# Patient Record
Sex: Female | Born: 1965 | Race: Black or African American | Hispanic: No | Marital: Single | Smoking: Never smoker
Health system: Southern US, Community
[De-identification: ages and names within clinical notes are randomized; demographics above are authoritative.]

## PROBLEM LIST (undated history)

## (undated) DIAGNOSIS — F32A Depression, unspecified: Secondary | ICD-10-CM

## (undated) DIAGNOSIS — J45909 Unspecified asthma, uncomplicated: Secondary | ICD-10-CM

## (undated) DIAGNOSIS — F419 Anxiety disorder, unspecified: Secondary | ICD-10-CM

## (undated) DIAGNOSIS — Z8042 Family history of malignant neoplasm of prostate: Secondary | ICD-10-CM

## (undated) DIAGNOSIS — Z800 Family history of malignant neoplasm of digestive organs: Secondary | ICD-10-CM

## (undated) DIAGNOSIS — Z803 Family history of malignant neoplasm of breast: Secondary | ICD-10-CM

## (undated) DIAGNOSIS — F329 Major depressive disorder, single episode, unspecified: Secondary | ICD-10-CM

## (undated) DIAGNOSIS — C801 Malignant (primary) neoplasm, unspecified: Secondary | ICD-10-CM

## (undated) DIAGNOSIS — I10 Essential (primary) hypertension: Secondary | ICD-10-CM

## (undated) HISTORY — PX: TONSILLECTOMY: SUR1361

## (undated) HISTORY — PX: TUBAL LIGATION: SHX77

## (undated) HISTORY — DX: Unspecified asthma, uncomplicated: J45.909

## (undated) HISTORY — DX: Family history of malignant neoplasm of prostate: Z80.42

## (undated) HISTORY — DX: Family history of malignant neoplasm of breast: Z80.3

## (undated) HISTORY — DX: Malignant (primary) neoplasm, unspecified: C80.1

## (undated) HISTORY — PX: COLONOSCOPY: SHX174

## (undated) HISTORY — DX: Family history of malignant neoplasm of digestive organs: Z80.0

---

## 2016-07-13 ENCOUNTER — Other Ambulatory Visit: Payer: Self-pay | Admitting: Gynecology

## 2016-07-13 ENCOUNTER — Other Ambulatory Visit: Payer: Self-pay | Admitting: Family Medicine

## 2016-07-13 DIAGNOSIS — R921 Mammographic calcification found on diagnostic imaging of breast: Secondary | ICD-10-CM

## 2016-07-20 ENCOUNTER — Ambulatory Visit
Admission: RE | Admit: 2016-07-20 | Discharge: 2016-07-20 | Disposition: A | Discharge status: Home / Self Care | Source: Ambulatory Visit | Attending: Gynecology | Admitting: Gynecology

## 2016-07-20 DIAGNOSIS — R921 Mammographic calcification found on diagnostic imaging of breast: Secondary | ICD-10-CM

## 2017-04-05 ENCOUNTER — Emergency Department (HOSPITAL_BASED_OUTPATIENT_CLINIC_OR_DEPARTMENT_OTHER)
Admission: EM | Admit: 2017-04-05 | Discharge: 2017-04-05 | Disposition: A | Discharge status: Home / Self Care | Attending: Emergency Medicine | Admitting: Emergency Medicine

## 2017-04-05 ENCOUNTER — Encounter (HOSPITAL_BASED_OUTPATIENT_CLINIC_OR_DEPARTMENT_OTHER): Payer: Self-pay | Admitting: *Deleted

## 2017-04-05 ENCOUNTER — Emergency Department (HOSPITAL_BASED_OUTPATIENT_CLINIC_OR_DEPARTMENT_OTHER)

## 2017-04-05 DIAGNOSIS — Y92410 Unspecified street and highway as the place of occurrence of the external cause: Secondary | ICD-10-CM | POA: Diagnosis not present

## 2017-04-05 DIAGNOSIS — S060X0A Concussion without loss of consciousness, initial encounter: Secondary | ICD-10-CM | POA: Diagnosis not present

## 2017-04-05 DIAGNOSIS — I10 Essential (primary) hypertension: Secondary | ICD-10-CM | POA: Insufficient documentation

## 2017-04-05 DIAGNOSIS — Z79899 Other long term (current) drug therapy: Secondary | ICD-10-CM | POA: Diagnosis not present

## 2017-04-05 DIAGNOSIS — Y999 Unspecified external cause status: Secondary | ICD-10-CM | POA: Insufficient documentation

## 2017-04-05 DIAGNOSIS — S0990XA Unspecified injury of head, initial encounter: Secondary | ICD-10-CM | POA: Diagnosis present

## 2017-04-05 DIAGNOSIS — Y9389 Activity, other specified: Secondary | ICD-10-CM | POA: Diagnosis not present

## 2017-04-05 HISTORY — DX: Anxiety disorder, unspecified: F41.9

## 2017-04-05 HISTORY — DX: Essential (primary) hypertension: I10

## 2017-04-05 HISTORY — DX: Depression, unspecified: F32.A

## 2017-04-05 HISTORY — DX: Major depressive disorder, single episode, unspecified: F32.9

## 2017-04-05 MED ORDER — MECLIZINE HCL 25 MG PO TABS: 25.0000 mg | ORAL_TABLET | Freq: Three times a day (TID) | ORAL | 0 refills | Status: DC | PRN

## 2017-04-05 MED ORDER — BACLOFEN 10 MG PO TABS: 10.0000 mg | ORAL_TABLET | Freq: Three times a day (TID) | ORAL | 0 refills | Status: DC

## 2017-04-05 MED ORDER — MELOXICAM 15 MG PO TABS: 15.0000 mg | ORAL_TABLET | Freq: Every day | ORAL | 0 refills | Status: DC
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
## 2017-04-05 NOTE — Discharge Instructions (Signed)
You have a concussion What are the signs or symptoms? Memory difficulties. Dizziness. Headaches. Double vision or blurry vision. Sensitivity to light. Hearing difficulties. Depression. Tiredness. Weakness. Difficulty with concentration. Difficulty sleeping or staying asleep. Vomiting. Poor balance or instability on your feet. Slow reaction time. Difficulty learning and remembering things you have heard. Get help right away if: You have confusion or unusual drowsiness. Others find it difficult to wake you up. You have nausea or persistent, forceful vomiting. You feel like you are moving when you are not (vertigo). Your eyes may move rapidly back and forth. You have convulsions or faint. You have severe, persistent headaches that are not relieved by medicine. You cannot use your arms or legs normally. One of your pupils is larger than the other. You have clear or bloody discharge from your nose or ears. Your problems are getting worse, not better.  Return to the emergency department immediately if you develop any of the following symptoms: You have numbness, tingling, or weakness in the arms or legs. You develop severe headaches not relieved with medicine. You have severe neck pain, especially tenderness in the middle of the back of your neck. You have changes in bowel or bladder control. There is increasing pain in any area of the body. You have shortness of breath, light-headedness, dizziness, or fainting. You have chest pain. You feel sick to your stomach (nauseous), throw up (vomit), or sweat. You have increasing abdominal discomfort. There is blood in your urine, stool, or vomit. You have pain in your shoulder (shoulder strap areas). You feel your symptoms are getting worse.

## 2017-04-05 NOTE — ED Notes (Signed)
ED Provider at bedside. 

## 2017-07-23 ENCOUNTER — Emergency Department (HOSPITAL_BASED_OUTPATIENT_CLINIC_OR_DEPARTMENT_OTHER)
Admission: EM | Admit: 2017-07-23 | Discharge: 2017-07-23 | Disposition: A | Discharge status: Home / Self Care | Attending: Emergency Medicine | Admitting: Emergency Medicine

## 2017-07-23 ENCOUNTER — Encounter (HOSPITAL_BASED_OUTPATIENT_CLINIC_OR_DEPARTMENT_OTHER): Payer: Self-pay | Admitting: Emergency Medicine

## 2017-07-23 ENCOUNTER — Emergency Department (HOSPITAL_BASED_OUTPATIENT_CLINIC_OR_DEPARTMENT_OTHER)

## 2017-07-23 DIAGNOSIS — R079 Chest pain, unspecified: Secondary | ICD-10-CM | POA: Insufficient documentation

## 2017-07-23 DIAGNOSIS — I10 Essential (primary) hypertension: Secondary | ICD-10-CM | POA: Insufficient documentation

## 2017-07-23 DIAGNOSIS — Z79899 Other long term (current) drug therapy: Secondary | ICD-10-CM | POA: Insufficient documentation

## 2017-07-23 LAB — BASIC METABOLIC PANEL
ANION GAP: 6 (ref 5–15)
BUN: 16 mg/dL (ref 6–20)
CHLORIDE: 106 mmol/L (ref 101–111)
CO2: 25 mmol/L (ref 22–32)
Calcium: 8.8 mg/dL — ABNORMAL LOW (ref 8.9–10.3)
Creatinine, Ser: 0.61 mg/dL (ref 0.44–1.00)
GFR calc Af Amer: >60 mL/min (ref >60)
Glucose, Bld: 100 mg/dL — ABNORMAL HIGH (ref 65–99)
POTASSIUM: 3.4 mmol/L — AB (ref 3.5–5.1)
SODIUM: 137 mmol/L (ref 135–145)

## 2017-07-23 LAB — CBC
HEMATOCRIT: 35 % — AB (ref 36.0–46.0)
HEMOGLOBIN: 11.3 g/dL — AB (ref 12.0–15.0)
MCH: 26.2 pg (ref 26.0–34.0)
MCHC: 32.3 g/dL (ref 30.0–36.0)
MCV: 81 fL (ref 78.0–100.0)
Platelets: 227 10*3/uL (ref 150–400)
RBC: 4.32 MIL/uL (ref 3.87–5.11)
RDW: 14.5 % (ref 11.5–15.5)
WBC: 7.2 10*3/uL (ref 4.0–10.5)

## 2017-07-23 LAB — D-DIMER, QUANTITATIVE (NOT AT ARMC)

## 2017-07-23 LAB — TROPONIN I: Troponin I: <0.03 ng/mL (ref <0.03)

## 2017-07-23 MED ORDER — ASPIRIN 81 MG PO CHEW
324.0000 mg | CHEWABLE_TABLET | Freq: Once | ORAL | Status: AC
  Administered 2017-07-23: 324 mg via ORAL
  Filled 2017-07-23: qty 4

## 2017-07-23 NOTE — ED Provider Notes (Signed)
[facility] EMERGENCY DEPARTMENT Provider Note   CSN: [id number] Arrival date & time: 07/23/17  1628     History   Chief Complaint Chief Complaint  Patient presents with  . Chest Pain    HPI Keylani Perlstein is a 51 y.o. female.  The history is provided by the patient.  Chest Pain   This is a new problem. The current episode started 2 days ago. The problem occurs constantly. The problem has not changed since onset.The pain is associated with breathing and coughing. Pertinent negatives include no abdominal pain, no fever and no shortness of breath.   51 year old female who presents with chest pain.she has a history of hypertension. Reports constant left-sided chest pain that began 2 days ago. It radiates to the left arm and shoulder. Took a dose of naproxen, with mild brief relief but had return of symptoms. She states that symptoms are worse with coughing and deep inspiration. She denies any syncope, near-syncope, shortness of breath, diaphoresis, nausea or vomiting, abdominal pain, lower extremity edema or calf tenderness. Does not think symptoms change with exertion or activity. Does not recall fall, injury, heavy lifting or exertional activity. No recent fevers or coughing.she has not had similar pain before. Denies history of tobacco use. Her mother had history of open heart surgery when her 62s. No recent immobilization, history of PE/DVT or fily history of PE/DVT, or exogenous hormones.  Past Medical History:  Diagnosis Date  . Anxiety   . Depression   . Hypertension     There are no active problems to display for this patient.   Past Surgical History:  Procedure Laterality Date  . TONSILLECTOMY    . TUBAL LIGATION      OB History    No data available       Home Medications    Prior to Admission medications   Medication Sig Start Date End Date Taking? Authorizing Provider  Acetaminophen 325 MG CAPS Take by mouth.    [provider]  baclofen  (LIORESAL) 10 MG tablet Take 1 tablet (10 mg total) by mouth 3 (three) times daily. 04/05/17   Margarita Mail, PA-C  hydrochlorothiazide (HYDRODIURIL) 25 MG tablet Take 12.5 mg by mouth daily.    [provider]  HYDROXYZINE PAMOATE PO Take by mouth.    [provider]  losartan (COZAAR) 50 MG tablet Take 25 mg by mouth daily.    [provider]  meclizine (ANTIVERT) 25 MG tablet Take 1 tablet (25 mg total) by mouth 3 (three) times daily as needed for dizziness. 04/05/17   Margarita Mail, PA-C  meloxicam (MOBIC) 15 MG tablet Take 1 tablet (15 mg total) by mouth daily. 04/05/17   Margarita Mail, PA-C  Multiple Vitamin (MULTIVITAMIN) tablet Take 1 tablet by mouth daily.    [provider]  sertraline (ZOLOFT) 100 MG tablet Take 100 mg by mouth 2 (two) times daily.    [provider]    Family History No family history on file.  Social History Social History  Substance Use Topics  . Smoking status: Never Smoker  . Smokeless tobacco: Never Used  . Alcohol use Yes     Comment: occ     Allergies   Patient has no known allergies.   Review of Systems Review of Systems  Constitutional: Negative for fever.  Respiratory: Negative for shortness of breath.   Cardiovascular: Positive for chest pain.  Gastrointestinal: Negative for abdominal pain.  All other systems reviewed and  are negative.    Physical Exam Updated Vital Signs BP (!) 151/81 (BP Location: Left Arm)   Pulse 74   Temp 98.7 F (37.1 C) (Oral)   Resp 18   Ht 5\' 2"  (1.575 m)   Wt 62.6 kg (138 lb)   SpO2 100%   BMI 25.24 kg/m   Physical Exam Physical Exam  Nursing note and vitals reviewed. Constitutional: Well developed, well nourished, non-toxic, and in no acute distress Head: Normocephalic and atraumatic.  Mouth/Throat: Oropharynx is clear and moist.  Neck: Normal range of motion. Neck supple.  Cardiovascular: Normal rate and regular rhythm.   Pulmonary/Chest: Effort  normal and breath sounds normal. No chest wall tenderness to palpation Abdominal: Soft. There is no tenderness. There is no rebound and no guarding.  Musculoskeletal: Normal range of motion.  Neurological: Alert, no facial droop, fluent speech, moves all extremities symmetrically Skin: Skin is warm and dry.  Psychiatric: Cooperative   ED Treatments / Results  Labs (all labs ordered are listed, but only abnormal results are displayed) Labs Reviewed  BASIC METABOLIC PANEL - Abnormal; Notable for the following:       Result Value   Potassium 3.4 (*)    Glucose, Bld 100 (*)    Calcium 8.8 (*)    All other components within normal limits  CBC - Abnormal; Notable for the following:    Hemoglobin 11.3 (*)    HCT 35.0 (*)    All other components within normal limits  TROPONIN I  D-DIMER, QUANTITATIVE (NOT AT [facility])  TROPONIN I    EKG  EKG Interpretation  Date/Time:  Tuesday July 23 2017 16:35:28 EDT Ventricular Rate:  68 PR Interval:  168 QRS Duration: 86 QT Interval:  422 QTC Calculation: 448 R Axis:   72 Text Interpretation:  Normal sinus rhythm Moderate voltage criteria for LVH, may be normal variant Borderline ECG no prior EKG  Confirmed by Brantley Stage [phone]) on 07/23/2017 5:24:21 PM       Radiology Dg Chest 2 View  Result Date: 07/23/2017 CLINICAL DATA:  Sudden left chest pain. Pain radiates to left upper extremity. EXAM: CHEST  2 VIEW COMPARISON:  None FINDINGS: Both lungs are clear. Negative for a pneumothorax. Heart and mediastinum are within normal limits. Trachea is midline. No large pleural effusions. Bony thorax appears to be intact. IMPRESSION: No active cardiopulmonary disease. Electronically Signed   By: Markus Daft M.D.   On: 07/23/2017 17:01    Procedures Procedures (including critical care time)  Medications Ordered in ED Medications  aspirin chewable tablet 324 mg (324 mg Oral Given 07/23/17 1742)     Initial Impression / Assessment and Plan / ED  Course  I have reviewed the triage vital signs and the nursing notes.  Pertinent labs & imaging results that were available during my care of the patient were reviewed by me and considered in my medical decision making (see chart for details).     51 year old female who presents with chest pain. Seems atypical in nature. Constant over several days. Not fully reproducible, but is musculoskeletal in nature. Heart score of 3, and felt low risk for ACS. Serial troponins are normal. EKG is nonischemic. Chest x-ray visualized and shows no acute cardiopulmonary processes. D-dimer is negative and she is felt to be ruled out for PE. Low suspicion dissection or  Other serious cardiopulmonary or intrathoracic etiologies of her symptoms. Does not appear to be related to GI issues. Felt stable for discharge home and PCP  follow-up. Strict return and follow-up instructions reviewed. She expressed understanding of all discharge instructions and felt comfortable with the plan of care.   Final Clinical Impressions(s) / ED Diagnoses   Final diagnoses:  Nonspecific chest pain    New Prescriptions New Prescriptions   No medications on file     Forde Dandy, MD 07/23/17 2101

## 2017-07-23 NOTE — ED Triage Notes (Signed)
L side chest pain since Sunday, radiating to back and L arm.

## 2017-07-23 NOTE — Discharge Instructions (Signed)
Your chest pain evaluation has been very reassuring. You are ruled out for heart attack and blood clot on the lungs. Your Chest x-ray is normal.  Take naprosyn or ibuprofen for pain. This may be muscle strain. Please follow-up with your PCP to discuss any further work-up Return for worsening symptoms, including difficulty breathing, escalating pain, passing out, or any other symptoms concerning ot you.

## 2018-05-16 IMAGING — DX DG CHEST 2V
2 series · 2 of 2 positions shown · non-contrast
Comparison: None

CLINICAL DATA: Sudden left chest pain. Pain radiates to left upper
extremity.

EXAM:
CHEST  2 VIEW

[chest pa]
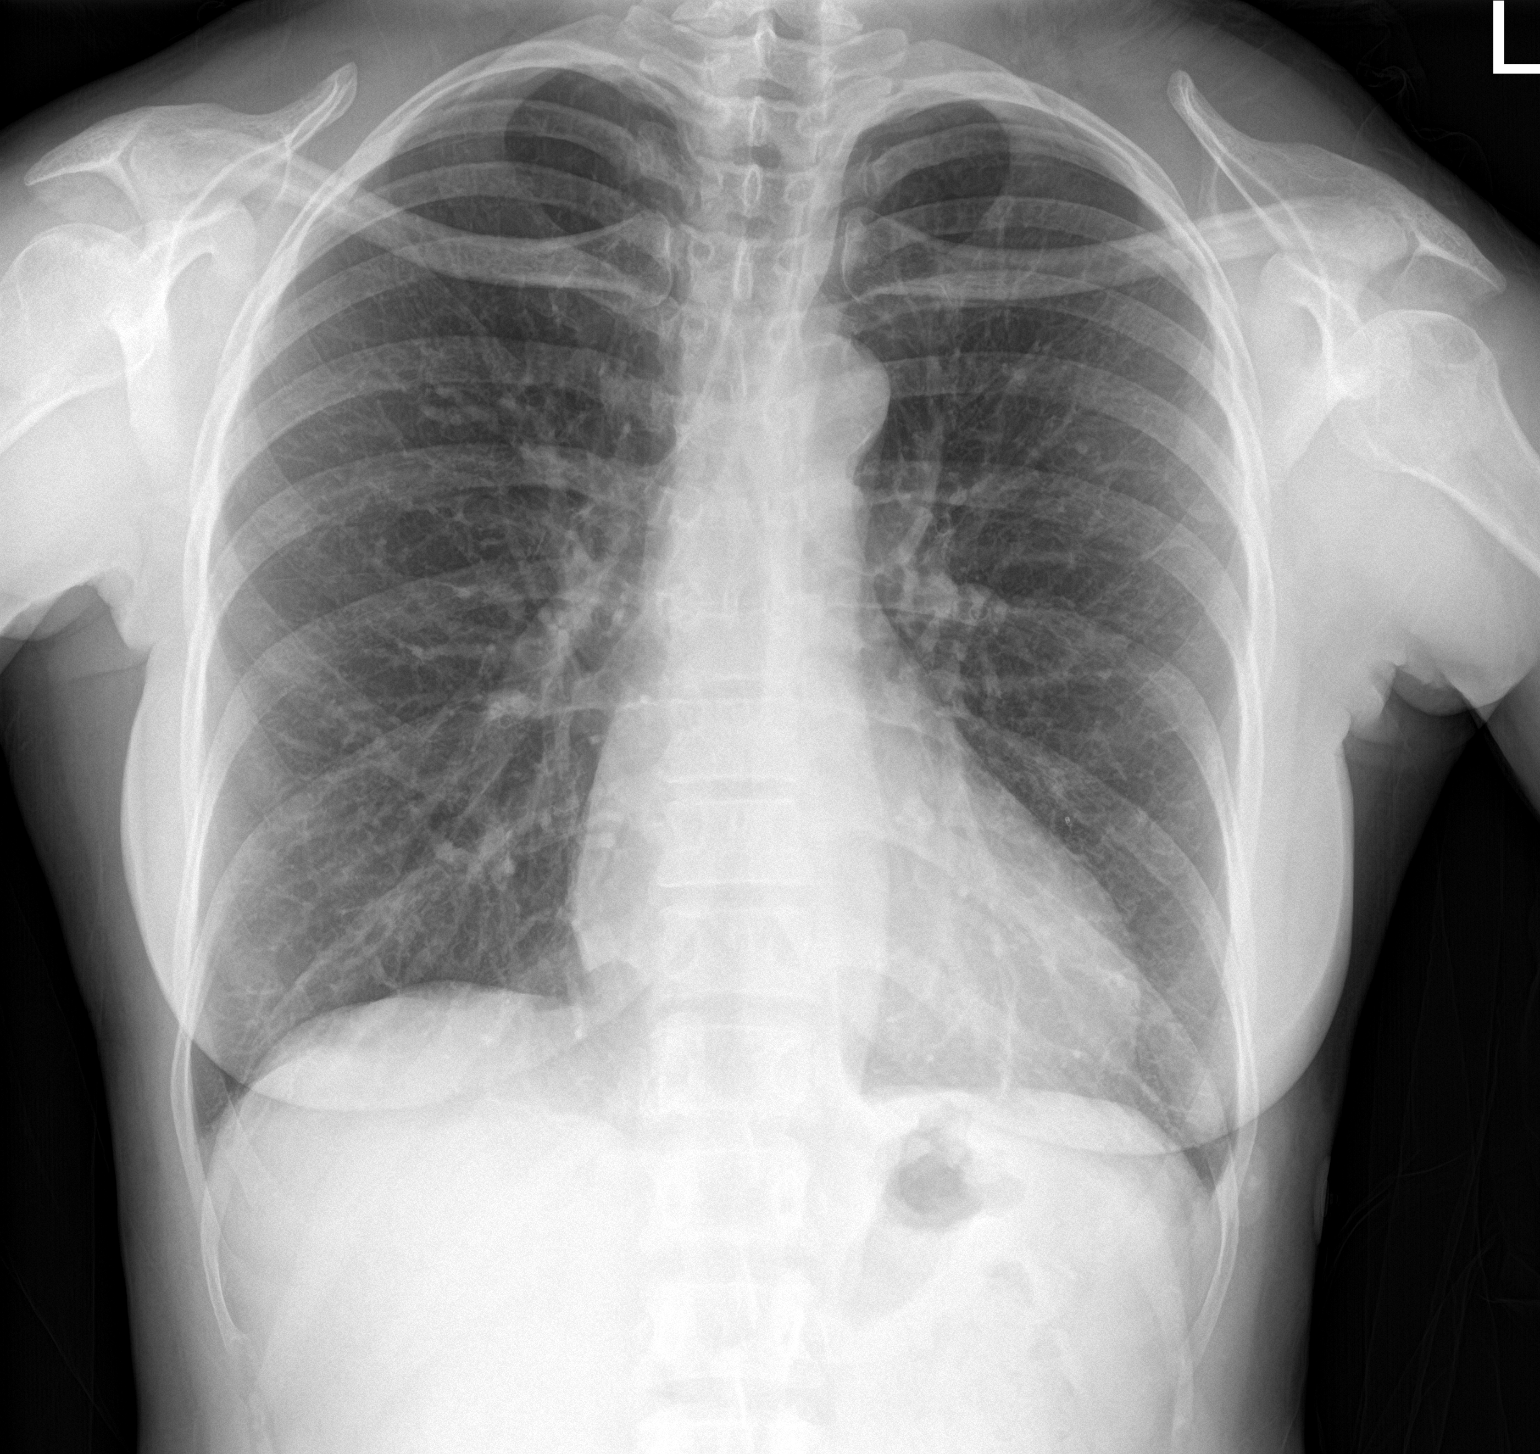

[chest lat]
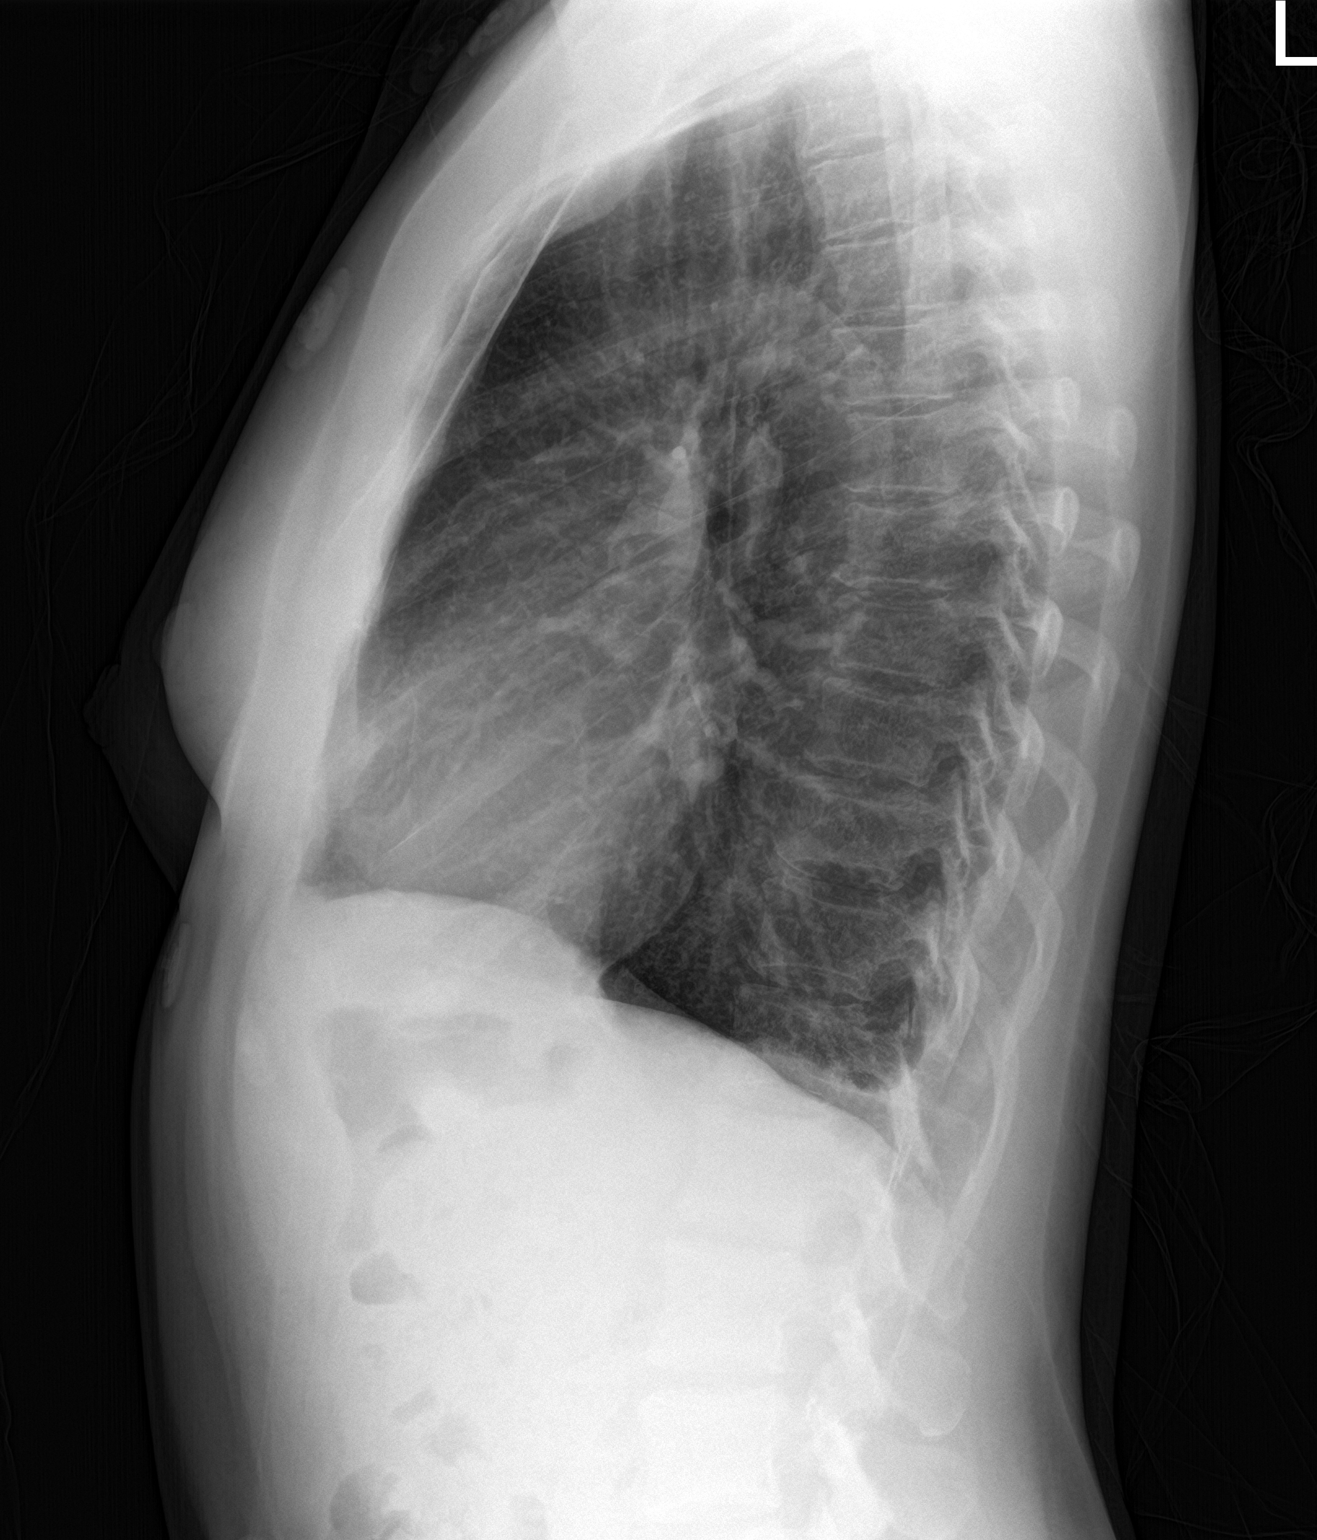

[2 of 2 positions shown; findings below may reference images not displayed]

FINDINGS: Both lungs are clear. Negative for a pneumothorax. Heart and
mediastinum are within normal limits. Trachea is midline. No large
pleural effusions. Bony thorax appears to be intact.
IMPRESSION: No active cardiopulmonary disease.

## 2018-08-06 ENCOUNTER — Telehealth: Payer: Self-pay | Admitting: Genetic Counselor

## 2018-08-06 ENCOUNTER — Encounter: Payer: Self-pay | Admitting: Genetic Counselor

## 2018-08-06 NOTE — Telephone Encounter (Signed)
A genetic counseling appt has been scheduled for the pt to see Roma Kayser on 11/20 at 9am. Pt aware to arrive 30 minutes early. Letter mailed.

## 2018-08-20 ENCOUNTER — Encounter: Payer: Self-pay | Admitting: Genetic Counselor

## 2018-08-20 ENCOUNTER — Inpatient Hospital Stay: Attending: Genetic Counselor | Admitting: Genetic Counselor

## 2018-08-20 ENCOUNTER — Inpatient Hospital Stay

## 2018-08-20 DIAGNOSIS — Z170 Estrogen receptor positive status [ER+]: Secondary | ICD-10-CM

## 2018-08-20 DIAGNOSIS — C50911 Malignant neoplasm of unspecified site of right female breast: Secondary | ICD-10-CM | POA: Diagnosis not present

## 2018-08-20 DIAGNOSIS — Z8042 Family history of malignant neoplasm of prostate: Secondary | ICD-10-CM | POA: Diagnosis not present

## 2018-08-20 DIAGNOSIS — Z7183 Encounter for nonprocreative genetic counseling: Secondary | ICD-10-CM

## 2018-08-20 DIAGNOSIS — Z800 Family history of malignant neoplasm of digestive organs: Secondary | ICD-10-CM

## 2018-08-20 DIAGNOSIS — Z803 Family history of malignant neoplasm of breast: Secondary | ICD-10-CM | POA: Insufficient documentation

## 2018-08-20 NOTE — Progress Notes (Signed)
REFERRING PROVIDER: Carmelia Roller, MD [place], [place] [address]  PRIMARY PROVIDER:  Clinic, [facility]  PRIMARY REASON FOR VISIT:  1. Family history of breast cancer   2. Family history of prostate cancer   3. Family history of colon cancer   4. Malignant neoplasm of right breast in female, estrogen receptor positive, unspecified site of breast ([place])      HISTORY OF PRESENT ILLNESS:   Sandra Pitts, a 52 y.o. female, was seen for a [facility] cancer genetics consultation at the request of Dr. Laurance Flatten due to a personal and family history of cancer.  Sandra Pitts presents to clinic today to discuss the possibility of a hereditary predisposition to cancer, genetic testing, and to further clarify her future cancer risks, as well as potential cancer risks for family members.   In November 2019, at the age of 63, Sandra Pitts was diagnosed with invasive ductal carcinoma of the right breast. This will be treated with chemotherapy and radiation.  Genetic testing will help inform her surgical options.  CANCER HISTORY:   No history exists.     HORMONAL RISK FACTORS:  Menarche was at age 35.  First live birth at age 36.  OCP use for approximately 5-10 years.  Ovaries intact: yes.  Hysterectomy: no.  Menopausal status: premenopausal.  HRT use: 0 years. Colonoscopy: yes; some polyps. Mammogram within the last year: yes. Number of breast biopsies: 1. Up to date with pelvic exams:  yes. Any excessive radiation exposure in the past:  no  Past Medical History:  Diagnosis Date  . Anxiety   . Depression   . Family history of breast cancer   . Family history of colon cancer   . Family history of prostate cancer   . Hypertension     Past Surgical History:  Procedure Laterality Date  . TONSILLECTOMY    . TUBAL LIGATION      Social History   Socioeconomic History  . Marital status: Single    Spouse name: Not on file  . Number of children: Not on file  . Years of  education: Not on file  . Highest education level: Not on file  Occupational History  . Not on file  Social Needs  . Financial resource strain: Not on file  . Food insecurity:    Worry: Not on file    Inability: Not on file  . Transportation needs:    Medical: Not on file    Non-medical: Not on file  Tobacco Use  . Smoking status: Never Smoker  . Smokeless tobacco: Never Used  Substance and Sexual Activity  . Alcohol use: Yes    Comment: occ  . Drug use: No  . Sexual activity: Not on file  Lifestyle  . Physical activity:    Days per week: Not on file    Minutes per session: Not on file  . Stress: Not on file  Relationships  . Social connections:    Talks on phone: Not on file    Gets together: Not on file    Attends religious service: Not on file    Active member of club or organization: Not on file    Attends meetings of clubs or organizations: Not on file    Relationship status: Not on file  Other Topics Concern  . Not on file  Social History Narrative  . Not on file     FAMILY HISTORY:  We obtained a detailed, 4-generation family history.  Significant  diagnoses are listed below: Family History  Problem Relation Age of Onset  . Breast cancer Mother 46       dbl mastectomy  . Prostate cancer Father        dx in his 86s  . Prostate cancer Brother 44       maternal half brother  . Brain cancer Maternal Grandmother        unsure if it was a benign or malignant tumor  . Prostate cancer Brother        paternal half brother; dx in his 61s  . Prostate cancer Maternal Uncle   . Colon cancer Maternal Uncle   . Prostate cancer Maternal Uncle   . Colon cancer Cousin 15       mat first cousin  . Throat cancer Cousin        mat first cousin  . Liver cancer Cousin        mat first cousin    The patient has one daughter and two sons who are all cancer free.  She has a maternal half brother and two sisters, and a paternal half brother and two sisters.  Her maternal  half brother had prostate cancer at 73, her paternal half brother also had prostate cancer in his 21's.  Both parents are living.  The patient's mother was diagnosed with breast cancer at age 69.  She had a double mastectomy.  She had four sisters and two brothers.  One brother had prostate and colon cancer in his 57's.  He had two children, one who died of liver cancer and one who died at 74 from a heart attack.  The patient's mother had a second brother who also had prostate cancer.  The maternal grandparents are deceased.  The grandmother had a brain tumor.  Her mother had breast cancer as well.  The patient's father was diagnosed with prostate cancer in his 57's.  He had four sisters and three brothers who are cancer free.  His parents died from natural causes and not cancer.  Sandra Pitts is unaware of previous family history of genetic testing for hereditary cancer risks. Patient's maternal ancestors are of African American descent, and paternal ancestors are of African American descent. There is no reported Ashkenazi Jewish ancestry. There is no known consanguinity.  GENETIC COUNSELING ASSESSMENT: Sandra Pitts is a 52 y.o. female with a personal history of breast cancer and family history of breast, prostate and colon cancer which is somewhat suggestive of a hereditary cancer syndrome and predisposition to cancer. We, therefore, discussed and recommended the following at today's visit.   DISCUSSION: We discussed that about 5-10% of breast cancer is hereditary with most cases due to BRCA mutations.  There are other genes that can increase the risk for breast cancer, as well as genes that could increase the risk for prostate and colon cancer.  She has a grandmother who had a brain tumor.  Two hereditary breast cancer genes, ATM ant TP53, can be associated with brain tumors, specifically meningioma's (ATM) and astrocytomas and gliomas (TP53).  While there are other genes associated with brain tumors,  relatively few are associated with breast cancer as well.  We reviewed the characteristics, features and inheritance patterns of hereditary cancer syndromes. We also discussed genetic testing, including the appropriate family members to test, the process of testing, insurance coverage and turn-around-time for results. We discussed the implications of a negative, positive and/or variant of uncertain significant result. We recommended Sandra Pitts pursue genetic  testing for the multi-cancer gene panel. The Multi-Gene Panel offered by [facility] includes sequencing and/or deletion duplication testing of the following 84 genes: AIP, ALK, APC, ATM, AXIN2,BAP1,  BARD1, BLM, BMPR1A, BRCA1, BRCA2, BRIP1, CASR, CDC73, CDH1, CDK4, CDKN1B, CDKN1C, CDKN2A (p14ARF), CDKN2A (p16INK4a), CEBPA, CHEK2, CTNNA1, DICER1, DIS3L2, EGFR (c.2369C>T, p.Thr790Met variant only), EPCAM (Deletion/duplication testing only), FH, FLCN, GATA2, GPC3, GREM1 (Promoter region deletion/duplication testing only), HOXB13 (c.251G>A, p.Gly84Glu), HRAS, KIT, MAX, MEN1, MET, MITF (c.952G>A, p.Glu318Lys variant only), MLH1, MSH2, MSH3, MSH6, MUTYH, NBN, NF1, NF2, NTHL1, PALB2, PDGFRA, PHOX2B, PMS2, POLD1, POLE, POT1, PRKAR1A, PTCH1, PTEN, RAD50, RAD51C, RAD51D, RB1, RECQL4, RET, RUNX1, SDHAF2, SDHA (sequence changes only), SDHB, SDHC, SDHD, SMAD4, SMARCA4, SMARCB1, SMARCE1, STK11, SUFU, TERC, TERT, TMEM127, TP53, TSC1, TSC2, VHL, WRN and WT1.    Based on Ms. Maxson's personal and family history of cancer, she meets medical criteria for genetic testing. Despite that she meets criteria, she may still have an out of pocket cost. We discussed that if her out of pocket cost for testing is over $100, the laboratory will call and confirm whether she wants to proceed with testing.  If the out of pocket cost of testing is less than $100 she will be billed by the genetic testing laboratory.   PLAN: After considering the risks, benefits, and limitations, Sandra Pitts   provided informed consent to pursue genetic testing and the blood sample was sent to [facility] for analysis of the multi cancer gene panel. Results should be available within approximately 2-3 weeks' time, at which point they will be disclosed by telephone to Sandra Pitts, as will any additional recommendations warranted by these results. Sandra Pitts will receive a summary of her genetic counseling visit and a copy of her results once available. This information will also be available in Epic. We encouraged Sandra Pitts to remain in contact with cancer genetics annually so that we can continuously update the family history and inform her of any changes in cancer genetics and testing that may be of benefit for her family. Sandra Pitts's questions were answered to her satisfaction today. Our contact information was provided should additional questions or concerns arise.  Lastly, we encouraged Sandra Pitts to remain in contact with cancer genetics annually so that we can continuously update the family history and inform her of any changes in cancer genetics and testing that may be of benefit for this family.   Ms.  Pitts's questions were answered to her satisfaction today. Our contact information was provided should additional questions or concerns arise. Thank you for the referral and allowing [place] to share in the care of your patient.   Karen P. Florene Glen, [place], [facility] Certified Genetic Counselor [email or web address] phone: [phone]  The patient was seen for a total of 60 minutes in face-to-face genetic counseling.  This patient was discussed with Drs. Magrinat, Lindi Adie and/or Burr Medico who agrees with the above.    _______________________________________________________________________ For Office Staff:  Number of people involved in session: 1 Was an Intern/ student involved with case: no

## 2018-09-02 ENCOUNTER — Ambulatory Visit: Payer: Self-pay | Admitting: Genetic Counselor

## 2018-09-02 ENCOUNTER — Telehealth: Payer: Self-pay | Admitting: Genetic Counselor

## 2018-09-02 ENCOUNTER — Encounter: Payer: Self-pay | Admitting: Genetic Counselor

## 2018-09-02 DIAGNOSIS — Z1379 Encounter for other screening for genetic and chromosomal anomalies: Secondary | ICD-10-CM | POA: Insufficient documentation

## 2018-09-02 NOTE — Telephone Encounter (Signed)
Revealed negative genetic testing.  Discussed that we do not know why she has breast cancer or why there is cancer in the family. It could be due to a different gene that we are not testing, or maybe our current technology may not be able to pick something up.  It will be important for her to keep in contact with genetics to keep up with whether additional testing may be needed.  Two VUS identified.  These will not change your medical management.

## 2018-09-02 NOTE — Progress Notes (Signed)
HPI:  Ms. Jeffries was previously seen in the [place] clinic due to a personal and family history of cancer and concerns regarding a hereditary predisposition to cancer. Please refer to our prior cancer genetics clinic note for more information regarding Ms. Alves's medical, social and family histories, and our assessment and recommendations, at the time. Ms. Howdeshell's recent genetic test results were disclosed to her, as were recommendations warranted by these results. These results and recommendations are discussed in more detail below.  CANCER HISTORY:    Malignant neoplasm of right breast ([place])   08/20/2018 Initial Diagnosis    Malignant neoplasm of right breast ([place])    09/01/2018 Genetic Testing    BAP1 c.908C>T and DICER1 c.1504G>A VUS identified on the multicancer panel.  The Multi-Gene Panel offered by [facility] includes sequencing and/or deletion duplication testing of the following 84 genes: AIP, ALK, APC, ATM, AXIN2,BAP1,  BARD1, BLM, BMPR1A, BRCA1, BRCA2, BRIP1, CASR, CDC73, CDH1, CDK4, CDKN1B, CDKN1C, CDKN2A (p14ARF), CDKN2A (p16INK4a), CEBPA, CHEK2, CTNNA1, DICER1, DIS3L2, EGFR (c.2369C>T, p.Thr790Met variant only), EPCAM (Deletion/duplication testing only), FH, FLCN, GATA2, GPC3, GREM1 (Promoter region deletion/duplication testing only), HOXB13 (c.251G>A, p.Gly84Glu), HRAS, KIT, MAX, MEN1, MET, MITF (c.952G>A, p.Glu318Lys variant only), MLH1, MSH2, MSH3, MSH6, MUTYH, NBN, NF1, NF2, NTHL1, PALB2, PDGFRA, PHOX2B, PMS2, POLD1, POLE, POT1, PRKAR1A, PTCH1, PTEN, RAD50, RAD51C, RAD51D, RB1, RECQL4, RET, RUNX1, SDHAF2, SDHA (sequence changes only), SDHB, SDHC, SDHD, SMAD4, SMARCA4, SMARCB1, SMARCE1, STK11, SUFU, TERC, TERT, TMEM127, TP53, TSC1, TSC2, VHL, WRN and WT1.  The report date is September 01, 2018.     FAMILY HISTORY:  We obtained a detailed, 4-generation family history.  Significant diagnoses are listed below: Family History  Problem Relation Age of Onset  . Breast  cancer Mother 61       dbl mastectomy  . Prostate cancer Father        dx in his 49s  . Prostate cancer Brother 52       maternal half brother  . Brain cancer Maternal Grandmother        unsure if it was a benign or malignant tumor  . Prostate cancer Brother        paternal half brother; dx in his 59s  . Prostate cancer Maternal Uncle   . Colon cancer Maternal Uncle   . Prostate cancer Maternal Uncle   . Colon cancer Cousin 34       mat first cousin  . Throat cancer Cousin        mat first cousin  . Liver cancer Cousin        mat first cousin    The patient has one daughter and two sons who are all cancer free.  She has a maternal half brother and two sisters, and a paternal half brother and two sisters.  Her maternal half brother had prostate cancer at 17, her paternal half brother also had prostate cancer in his 54's.  Both parents are living.  The patient's mother was diagnosed with breast cancer at age 21.  She had a double mastectomy.  She had four sisters and two brothers.  One brother had prostate and colon cancer in his 56's.  He had two children, one who died of liver cancer and one who died at 56 from a heart attack.  The patient's mother had a second brother who also had prostate cancer.  The maternal grandparents are deceased.  The grandmother had a brain tumor.  Her mother had breast cancer as well.  The patient's father was diagnosed with prostate cancer in his 15's.  He had four sisters and three brothers who are cancer free.  His parents died from natural causes and not cancer.  Ms. Goddard is unaware of previous family history of genetic testing for hereditary cancer risks. Patient's maternal ancestors are of African American descent, and paternal ancestors are of African American descent. There is no reported Ashkenazi Jewish ancestry. There is no known consanguinity.  GENETIC TEST RESULTS: Genetic testing reported out on September 01, 2018 through the multi-cancer  panel found no deleterious mutations. The Multi-Gene Panel offered by [facility] includes sequencing and/or deletion duplication testing of the following 84 genes: AIP, ALK, APC, ATM, AXIN2,BAP1,  BARD1, BLM, BMPR1A, BRCA1, BRCA2, BRIP1, CASR, CDC73, CDH1, CDK4, CDKN1B, CDKN1C, CDKN2A (p14ARF), CDKN2A (p16INK4a), CEBPA, CHEK2, CTNNA1, DICER1, DIS3L2, EGFR (c.2369C>T, p.Thr790Met variant only), EPCAM (Deletion/duplication testing only), FH, FLCN, GATA2, GPC3, GREM1 (Promoter region deletion/duplication testing only), HOXB13 (c.251G>A, p.Gly84Glu), HRAS, KIT, MAX, MEN1, MET, MITF (c.952G>A, p.Glu318Lys variant only), MLH1, MSH2, MSH3, MSH6, MUTYH, NBN, NF1, NF2, NTHL1, PALB2, PDGFRA, PHOX2B, PMS2, POLD1, POLE, POT1, PRKAR1A, PTCH1, PTEN, RAD50, RAD51C, RAD51D, RB1, RECQL4, RET, RUNX1, SDHAF2, SDHA (sequence changes only), SDHB, SDHC, SDHD, SMAD4, SMARCA4, SMARCB1, SMARCE1, STK11, SUFU, TERC, TERT, TMEM127, TP53, TSC1, TSC2, VHL, WRN and WT1.   The test report has been scanned into EPIC and is located under the Molecular Pathology section of the Results Review tab.    We discussed with Ms. Herringshaw that since the current genetic testing is not perfect, it is possible there may be a gene mutation in one of these genes that current testing cannot detect, but that chance is small.  We also discussed, that it is possible that another gene that has not yet been discovered, or that we have not yet tested, is responsible for the cancer diagnoses in the family, and it is, therefore, important to remain in touch with cancer genetics in the future so that we can continue to offer Ms. Tonelli the most up to date genetic testing.   Genetic testing did detect two Variants of Unknown Significance - one in the BAP1 gene called c.908C>T and the other in the DICER1 gene called c.1504G>A.  At this time, it is unknown if theses variants are associated with increased cancer risk or if they are a normal finding, but most variants such as  these get reclassified to being inconsequential. It should not be used to make medical management decisions. With time, we suspect the lab will determine the significance of these variants, if any. If we do learn more about it, we will try to contact Ms. Blankenbeckler to discuss it further. However, it is important to stay in touch with [place] periodically and keep the address and phone number up to date.    CANCER SCREENING RECOMMENDATIONS:  This result is reassuring and indicates that Ms. Hajjar likely does not have an increased risk for a future cancer due to a mutation in one of these genes. This normal test also suggests that Ms. Cogan's cancer was most likely not due to an inherited predisposition associated with one of these genes.  Most cancers happen by chance and this negative test suggests that her cancer falls into this category.  We, therefore, recommended she continue to follow the cancer management and screening guidelines provided by her oncology and primary healthcare provider.   An individual's cancer risk and medical management are not determined by genetic test results alone. Overall cancer risk assessment  incorporates additional factors, including personal medical history, family history, and any available genetic information that may result in a personalized plan for cancer prevention and surveillance.  RECOMMENDATIONS FOR FAMILY MEMBERS:  Women in this family might be at some increased risk of developing cancer, over the general population risk, simply due to the family history of cancer.  We recommended women in this family have a yearly mammogram beginning at age 17, or 70 years younger than the earliest onset of cancer, an annual clinical breast exam, and perform monthly breast self-exams. Women in this family should also have a gynecological exam as recommended by their primary provider. All family members should have a colonoscopy by age 94.  FOLLOW-UP: Lastly, we discussed with Ms. Kennington  that cancer genetics is a rapidly advancing field and it is possible that new genetic tests will be appropriate for her and/or her family members in the future. We encouraged her to remain in contact with cancer genetics on an annual basis so we can update her personal and family histories and let her know of advances in cancer genetics that may benefit this family.   Our contact number was provided. Ms. Camarena's questions were answered to her satisfaction, and she knows she is welcome to call us at anytime with additional questions or concerns.   Roma Kayser, MS, [facility] Certified Genetic Counselor [email or web address]

## 2020-06-28 ENCOUNTER — Other Ambulatory Visit: Payer: Self-pay | Admitting: Nurse Practitioner

## 2020-08-01 ENCOUNTER — Other Ambulatory Visit: Payer: Self-pay

## 2020-08-01 ENCOUNTER — Ambulatory Visit (AMBULATORY_SURGERY_CENTER): Payer: Self-pay

## 2020-08-01 VITALS — Ht 62.0 in | Wt 133.0 lb

## 2020-08-01 DIAGNOSIS — Z1211 Encounter for screening for malignant neoplasm of colon: Secondary | ICD-10-CM

## 2020-08-01 MED ORDER — PEG-KCL-NACL-NASULF-NA ASC-C 100 G PO SOLR: 1.0000 | Freq: Once | ORAL | 0 refills | Status: AC

## 2020-08-01 NOTE — Progress Notes (Signed)
No egg or soy allergy known to patient  No issues with past sedation with any surgeries or procedures No intubation problems in the past  No FH of Malignant Hyperthermia No diet pills per patient No home 02 use per patient  No blood thinners per patient  Pt reports intermittent issues with constipation  No A fib or A flutter  EMMI video via Bernice 19 guidelines implemented in PV today with Pt and RN  COVID vaccines completed on 06/01/2020 per pt; Due to the COVID-19 pandemic we are asking patients to follow these guidelines. Please only bring one care partner. Please be aware that your care partner may wait in the car in the parking lot or if they feel like they will be too hot to wait in the car, they may wait in the lobby on the 4th floor. All care partners are required to wear a mask the entire time (we do not have any that we can provide them), they need to practice social distancing, and we will do a Covid check for all patient's and care partners when you arrive. Also we will check their temperature and your temperature. If the care partner waits in their car they need to stay in the parking lot the entire time and we will call them on their cell phone when the patient is ready for discharge so they can bring the car to the front of the building. Also all patient's will need to wear a mask into building.

## 2020-08-15 ENCOUNTER — Encounter: Payer: Self-pay | Admitting: Gastroenterology

## 2020-08-15 ENCOUNTER — Ambulatory Visit (AMBULATORY_SURGERY_CENTER): Admitting: Gastroenterology

## 2020-08-15 ENCOUNTER — Other Ambulatory Visit: Payer: Self-pay

## 2020-08-15 VITALS — BP 129/60 | HR 56 | Temp 97.7°F | Resp 12 | Ht 62.0 in | Wt 133.0 lb

## 2020-08-15 DIAGNOSIS — Z1211 Encounter for screening for malignant neoplasm of colon: Secondary | ICD-10-CM

## 2020-08-15 MED ORDER — SODIUM CHLORIDE 0.9 % IV SOLN: 500.0000 mL | INTRAVENOUS | Status: DC

## 2020-08-15 NOTE — Op Note (Signed)
[facility] Patient Name: Sandra Pitts Procedure Date: 08/15/2020 2:16 PM MRN: [id number] Endoscopist: Milus Banister , MD Age: 54 Referring MD:  Date of Birth: [birth year] Gender: Female Account #: [id number] Procedure:                Colonoscopy Indications:              Screening for colorectal malignant neoplasm Medicines:                Monitored Anesthesia Care Procedure:                Pre-Anesthesia Assessment:                           - Prior to the procedure, a History and Physical                            was performed, and patient medications and                            allergies were reviewed. The patient's tolerance of                            previous anesthesia was also reviewed. The risks                            and benefits of the procedure and the sedation                            options and risks were discussed with the patient.                            All questions were answered, and informed consent                            was obtained. Prior Anticoagulants: The patient has                            taken no previous anticoagulant or antiplatelet                            agents. ASA Grade Assessment: II - A patient with                            mild systemic disease. After reviewing the risks                            and benefits, the patient was deemed in                            satisfactory condition to undergo the procedure.                           After obtaining informed consent, the colonoscope  was passed under direct vision. Throughout the                            procedure, the patient's blood pressure, pulse, and                            oxygen saturations were monitored continuously. The                            Colonoscope was introduced through the anus and                            advanced to the the cecum, identified by                            appendiceal orifice and  ileocecal valve. The                            colonoscopy was performed without difficulty. The                            patient tolerated the procedure well. The quality                            of the bowel preparation was good. The ileocecal                            valve, appendiceal orifice, and rectum were                            photographed. Scope In: 2:27:36 PM Scope Out: 2:43:51 PM Scope Withdrawal Time: 0 hours 7 minutes 31 seconds  Total Procedure Duration: 0 hours 16 minutes 15 seconds  Findings:                 The entire examined colon appeared normal on direct                            and retroflexion views. Complications:            No immediate complications. Estimated blood loss:                            None. Estimated Blood Loss:     Estimated blood loss: none. Impression:               - The entire examined colon is normal on direct and                            retroflexion views.                           - No polyps or cancers. Recommendation:           - Patient has a contact number available for  emergencies. The signs and symptoms of potential                            delayed complications were discussed with the                            patient. Return to normal activities tomorrow.                            Written discharge instructions were provided to the                            patient.                           - Resume previous diet.                           - Continue present medications.                           - Repeat colonoscopy in 10 years for screening. Milus Banister, MD 08/15/2020 2:45:22 PM This report has been signed electronically.

## 2020-08-15 NOTE — Patient Instructions (Signed)
You may resume your previous diet and medication schedule.  Thank you for allowing [place] to care for you today!!!   YOU HAD AN ENDOSCOPIC PROCEDURE TODAY AT [facility]:   Refer to the procedure report that was given to you for any specific questions about what was found during the examination.  If the procedure report does not answer your questions, please call your gastroenterologist to clarify.  If you requested that your care partner not be given the details of your procedure findings, then the procedure report has been included in a sealed envelope for you to review at your convenience later.  YOU SHOULD EXPECT: Some feelings of bloating in the abdomen. Passage of more gas than usual.  Walking can help get rid of the air that was put into your GI tract during the procedure and reduce the bloating. If you had a lower endoscopy (such as a colonoscopy or flexible sigmoidoscopy) you may notice spotting of blood in your stool or on the toilet paper. If you underwent a bowel prep for your procedure, you may not have a normal bowel movement for a few days.  Please Note:  You might notice some irritation and congestion in your nose or some drainage.  This is from the oxygen used during your procedure.  There is no need for concern and it should clear up in a day or so.  SYMPTOMS TO REPORT IMMEDIATELY:   Following lower endoscopy (colonoscopy or flexible sigmoidoscopy):  Excessive amounts of blood in the stool  Significant tenderness or worsening of abdominal pains  Swelling of the abdomen that is new, acute  Fever of 100F or higher  For urgent or emergent issues, a gastroenterologist can be reached at any hour by calling [phone]. Do not use MyChart messaging for urgent concerns.    DIET:  We do recommend a small meal at first, but then you may proceed to your regular diet.  Drink plenty of fluids but you should avoid alcoholic beverages for 24 hours.  ACTIVITY:  You  should plan to take it easy for the rest of today and you should NOT DRIVE or use heavy machinery until tomorrow (because of the sedation medicines used during the test).    FOLLOW UP: Our staff will call the number listed on your records Wednesday morning between 7:15 am and 8:15 am to check on you and address any questions or concerns that you may have regarding the information given to you following your procedure. If we do not reach you, we will leave a message.  We will attempt to reach you two times.  During this call, we will ask if you have developed any symptoms of COVID 19. If you develop any symptoms (ie: fever, flu-like symptoms, shortness of breath, cough etc.) before then, please call [phone].  If you test positive for Covid 19 in the 2 weeks post procedure, please call and report this information to [place].    If any biopsies were taken you will be contacted by phone or by letter within the next 1-3 weeks.  Please call us at [phone] if you have not heard about the biopsies in 3 weeks.    SIGNATURES/CONFIDENTIALITY: You and/or your care partner have signed paperwork which will be entered into your electronic medical record.  These signatures attest to the fact that that the information above on your After Visit Summary has been reviewed and is understood.  Full responsibility of the confidentiality of this discharge information  lies with you and/or your care-partner.

## 2020-08-15 NOTE — Progress Notes (Signed)
To pacu, VSS. Report to [email or web address] °

## 2020-08-17 ENCOUNTER — Telehealth: Payer: Self-pay | Admitting: *Deleted

## 2020-08-17 NOTE — Telephone Encounter (Signed)
°  Follow up Call-  Call back number 08/15/2020  Post procedure Call Back phone  # [phone]  Permission to leave phone message Yes  Some recent data might be hidden     Patient questions:  Do you have a fever, pain , or abdominal swelling? No. Pain Score  0 *  Have you tolerated food without any problems? Yes.    Have you been able to return to your normal activities? Yes.    Do you have any questions about your discharge instructions: Diet   No. Medications  No. Follow up visit  No.  Do you have questions or concerns about your Care? No.  Actions: * If pain score is 4 or above: No action needed, pain <4.  1. Have you developed a fever since your procedure? no  2.   Have you had an respiratory symptoms (SOB or cough) since your procedure? no  3.   Have you tested positive for COVID 19 since your procedure no  4.   Have you had any family members/close contacts diagnosed with the COVID 19 since your procedure?  no   If yes to any of these questions please route to Joylene John, RN and Joella Prince, RN
